# Patient Record
Sex: Female | Born: 1935 | Race: White | Hispanic: No | Smoking: Never smoker
Health system: Southern US, Community
[De-identification: ages and names within clinical notes are randomized; demographics above are authoritative.]

## PROBLEM LIST (undated history)

## (undated) DIAGNOSIS — G20A1 Parkinson's disease without dyskinesia, without mention of fluctuations: Secondary | ICD-10-CM

## (undated) DIAGNOSIS — G20 Parkinson's disease: Secondary | ICD-10-CM

## (undated) DIAGNOSIS — F0390 Unspecified dementia without behavioral disturbance: Secondary | ICD-10-CM

## (undated) DIAGNOSIS — I639 Cerebral infarction, unspecified: Secondary | ICD-10-CM

## (undated) HISTORY — PX: KNEE RECONSTRUCTION: SHX5883

---

## 2009-11-06 ENCOUNTER — Emergency Department: Payer: Self-pay | Admitting: Internal Medicine

## 2011-11-09 ENCOUNTER — Emergency Department: Payer: Self-pay | Admitting: Emergency Medicine

## 2015-01-16 ENCOUNTER — Observation Stay
Admission: EM | Admit: 2015-01-16 | Discharge: 2015-01-19 | Disposition: A | Discharge status: Skilled Nursing Facility | Payer: Medicare Other | Attending: Internal Medicine | Admitting: Internal Medicine

## 2015-01-16 ENCOUNTER — Emergency Department: Payer: Medicare Other

## 2015-01-16 DIAGNOSIS — Z79899 Other long term (current) drug therapy: Secondary | ICD-10-CM | POA: Insufficient documentation

## 2015-01-16 DIAGNOSIS — W19XXXA Unspecified fall, initial encounter: Secondary | ICD-10-CM | POA: Diagnosis not present

## 2015-01-16 DIAGNOSIS — N390 Urinary tract infection, site not specified: Principal | ICD-10-CM

## 2015-01-16 DIAGNOSIS — Z7982 Long term (current) use of aspirin: Secondary | ICD-10-CM | POA: Diagnosis not present

## 2015-01-16 DIAGNOSIS — Z8673 Personal history of transient ischemic attack (TIA), and cerebral infarction without residual deficits: Secondary | ICD-10-CM | POA: Insufficient documentation

## 2015-01-16 DIAGNOSIS — L03115 Cellulitis of right lower limb: Secondary | ICD-10-CM | POA: Insufficient documentation

## 2015-01-16 DIAGNOSIS — Z7952 Long term (current) use of systemic steroids: Secondary | ICD-10-CM | POA: Diagnosis not present

## 2015-01-16 DIAGNOSIS — G20 Parkinson's disease: Secondary | ICD-10-CM | POA: Diagnosis not present

## 2015-01-16 DIAGNOSIS — I10 Essential (primary) hypertension: Secondary | ICD-10-CM | POA: Diagnosis not present

## 2015-01-16 DIAGNOSIS — Z66 Do not resuscitate: Secondary | ICD-10-CM | POA: Insufficient documentation

## 2015-01-16 DIAGNOSIS — Z881 Allergy status to other antibiotic agents status: Secondary | ICD-10-CM | POA: Insufficient documentation

## 2015-01-16 DIAGNOSIS — S80811A Abrasion, right lower leg, initial encounter: Secondary | ICD-10-CM

## 2015-01-16 DIAGNOSIS — R531 Weakness: Secondary | ICD-10-CM | POA: Diagnosis not present

## 2015-01-16 DIAGNOSIS — N289 Disorder of kidney and ureter, unspecified: Secondary | ICD-10-CM | POA: Insufficient documentation

## 2015-01-16 DIAGNOSIS — R627 Adult failure to thrive: Secondary | ICD-10-CM | POA: Diagnosis not present

## 2015-01-16 DIAGNOSIS — F0150 Vascular dementia without behavioral disturbance: Secondary | ICD-10-CM | POA: Diagnosis not present

## 2015-01-16 DIAGNOSIS — Z23 Encounter for immunization: Secondary | ICD-10-CM | POA: Diagnosis not present

## 2015-01-16 DIAGNOSIS — Z9889 Other specified postprocedural states: Secondary | ICD-10-CM | POA: Insufficient documentation

## 2015-01-16 DIAGNOSIS — L0390 Cellulitis, unspecified: Secondary | ICD-10-CM

## 2015-01-16 HISTORY — DX: Unspecified dementia, unspecified severity, without behavioral disturbance, psychotic disturbance, mood disturbance, and anxiety: F03.90

## 2015-01-16 HISTORY — DX: Parkinson's disease: G20

## 2015-01-16 HISTORY — DX: Cerebral infarction, unspecified: I63.9

## 2015-01-16 HISTORY — DX: Parkinson's disease without dyskinesia, without mention of fluctuations: G20.A1

## 2015-01-16 LAB — CBC WITH DIFFERENTIAL/PLATELET
BASOS ABS: 0 10*3/uL (ref 0–0.1)
BASOS PCT: 0 %
EOS ABS: 0 10*3/uL (ref 0–0.7)
EOS PCT: 0 %
HCT: 35.9 % (ref 35.0–47.0)
Hemoglobin: 12 g/dL (ref 12.0–16.0)
Lymphocytes Relative: 5 %
Lymphs Abs: 0.5 10*3/uL — ABNORMAL LOW (ref 1.0–3.6)
MCH: 29.5 pg (ref 26.0–34.0)
MCHC: 33.3 g/dL (ref 32.0–36.0)
MCV: 88.6 fL (ref 80.0–100.0)
MONO ABS: 0.7 10*3/uL (ref 0.2–0.9)
Monocytes Relative: 6 %
Neutro Abs: 9.7 10*3/uL — ABNORMAL HIGH (ref 1.4–6.5)
Neutrophils Relative %: 89 %
PLATELETS: 163 10*3/uL (ref 150–440)
RBC: 4.06 MIL/uL (ref 3.80–5.20)
RDW: 14.5 % (ref 11.5–14.5)
WBC: 10.9 10*3/uL (ref 3.6–11.0)

## 2015-01-16 LAB — COMPREHENSIVE METABOLIC PANEL
ALBUMIN: 4 g/dL (ref 3.5–5.0)
ALT: 18 U/L (ref 14–54)
AST: 115 U/L — AB (ref 15–41)
Alkaline Phosphatase: 44 U/L (ref 38–126)
Anion gap: 8 (ref 5–15)
BUN: 27 mg/dL — AB (ref 6–20)
CHLORIDE: 103 mmol/L (ref 101–111)
CO2: 28 mmol/L (ref 22–32)
Calcium: 9.3 mg/dL (ref 8.9–10.3)
Creatinine, Ser: 1.15 mg/dL — ABNORMAL HIGH (ref 0.44–1.00)
GFR calc Af Amer: 49 mL/min — ABNORMAL LOW (ref >60)
GFR, EST NON AFRICAN AMERICAN: 42 mL/min — AB (ref >60)
Glucose, Bld: 130 mg/dL — ABNORMAL HIGH (ref 65–99)
POTASSIUM: 3.9 mmol/L (ref 3.5–5.1)
SODIUM: 139 mmol/L (ref 135–145)
Total Bilirubin: 0.9 mg/dL (ref 0.3–1.2)
Total Protein: 7.1 g/dL (ref 6.5–8.1)

## 2015-01-16 LAB — URINALYSIS COMPLETE WITH MICROSCOPIC (ARMC ONLY)
BILIRUBIN URINE: NEGATIVE
GLUCOSE, UA: NEGATIVE mg/dL
Nitrite: POSITIVE — AB
PH: 5 (ref 5.0–8.0)
Protein, ur: 30 mg/dL — AB
SPECIFIC GRAVITY, URINE: 1.025 (ref 1.005–1.030)

## 2015-01-16 LAB — TROPONIN I: TROPONIN I: 0.03 ng/mL (ref <0.031)

## 2015-01-16 MED ORDER — BISACODYL 10 MG RE SUPP: 10.0000 mg | Freq: Every day | RECTAL | Status: DC | PRN

## 2015-01-16 MED ORDER — PNEUMOCOCCAL VAC POLYVALENT 25 MCG/0.5ML IJ INJ
0.5000 mL | INJECTION | INTRAMUSCULAR | Status: AC
  Administered 2015-01-17: 0.5 mL via INTRAMUSCULAR
  Filled 2015-01-16: qty 0.5

## 2015-01-16 MED ORDER — SENNA 8.6 MG PO TABS
2.0000 | ORAL_TABLET | Freq: Every day | ORAL | Status: DC | PRN
Start: 2015-01-16 — End: 2015-01-19
  Administered 2015-01-17: 17.2 mg via ORAL
  Filled 2015-01-16: qty 2

## 2015-01-16 MED ORDER — NYSTATIN 100000 UNIT/GM EX POWD
1.0000 g | Freq: Two times a day (BID) | CUTANEOUS | Status: DC
  Administered 2015-01-16 – 2015-01-19 (×6): 1 g via TOPICAL
  Filled 2015-01-16: qty 15

## 2015-01-16 MED ORDER — PRIMIDONE 250 MG PO TABS
250.0000 mg | ORAL_TABLET | Freq: Every day | ORAL | Status: DC
  Administered 2015-01-16 – 2015-01-18 (×3): 250 mg via ORAL
  Filled 2015-01-16 (×4): qty 1

## 2015-01-16 MED ORDER — HYDROCODONE-ACETAMINOPHEN 5-325 MG PO TABS: 1.0000 | ORAL_TABLET | ORAL | Status: DC | PRN

## 2015-01-16 MED ORDER — ONDANSETRON HCL 4 MG/2ML IJ SOLN: 4.0000 mg | Freq: Four times a day (QID) | INTRAMUSCULAR | Status: DC | PRN

## 2015-01-16 MED ORDER — PANTOPRAZOLE SODIUM 40 MG PO TBEC
40.0000 mg | DELAYED_RELEASE_TABLET | Freq: Every day | ORAL | Status: DC
  Administered 2015-01-17 – 2015-01-19 (×3): 40 mg via ORAL
  Filled 2015-01-16 (×3): qty 1

## 2015-01-16 MED ORDER — ONDANSETRON HCL 4 MG PO TABS: 4.0000 mg | ORAL_TABLET | Freq: Four times a day (QID) | ORAL | Status: DC | PRN

## 2015-01-16 MED ORDER — CARBIDOPA-LEVODOPA 25-250 MG PO TABS
1.0000 | ORAL_TABLET | Freq: Two times a day (BID) | ORAL | Status: DC
  Administered 2015-01-16 – 2015-01-19 (×6): 1 via ORAL
  Filled 2015-01-16 (×6): qty 1

## 2015-01-16 MED ORDER — PREDNISONE 5 MG PO TABS
5.0000 mg | ORAL_TABLET | Freq: Every day | ORAL | Status: DC
  Administered 2015-01-17 – 2015-01-19 (×3): 5 mg via ORAL
  Filled 2015-01-16 (×3): qty 1

## 2015-01-16 MED ORDER — DOCUSATE SODIUM 100 MG PO CAPS
100.0000 mg | ORAL_CAPSULE | Freq: Two times a day (BID) | ORAL | Status: DC
  Administered 2015-01-16 – 2015-01-19 (×7): 100 mg via ORAL
  Filled 2015-01-16 (×7): qty 1

## 2015-01-16 MED ORDER — POTASSIUM CHLORIDE IN NACL 20-0.9 MEQ/L-% IV SOLN
INTRAVENOUS | Status: DC
  Administered 2015-01-16: 1000 mL via INTRAVENOUS
  Administered 2015-01-16 – 2015-01-19 (×6): via INTRAVENOUS
  Filled 2015-01-16 (×11): qty 1000

## 2015-01-16 MED ORDER — SULFAMETHOXAZOLE-TRIMETHOPRIM 800-160 MG PO TABS
1.0000 | ORAL_TABLET | Freq: Two times a day (BID) | ORAL | Status: DC
  Administered 2015-01-16 – 2015-01-19 (×6): 1 via ORAL
  Filled 2015-01-16 (×7): qty 1

## 2015-01-16 MED ORDER — ASPIRIN 81 MG PO CHEW
81.0000 mg | CHEWABLE_TABLET | Freq: Every day | ORAL | Status: DC
  Administered 2015-01-17 – 2015-01-19 (×3): 81 mg via ORAL
  Filled 2015-01-16 (×3): qty 1

## 2015-01-16 MED ORDER — HEPARIN SODIUM (PORCINE) 5000 UNIT/ML IJ SOLN
5000.0000 [IU] | Freq: Three times a day (TID) | INTRAMUSCULAR | Status: DC
  Administered 2015-01-16 – 2015-01-18 (×6): 5000 [IU] via SUBCUTANEOUS
  Filled 2015-01-16 (×6): qty 1

## 2015-01-16 MED ORDER — ACETAMINOPHEN 325 MG PO TABS
650.0000 mg | ORAL_TABLET | Freq: Four times a day (QID) | ORAL | Status: DC | PRN
  Administered 2015-01-16 – 2015-01-19 (×2): 650 mg via ORAL
  Filled 2015-01-16 (×2): qty 2

## 2015-01-16 MED ORDER — ACETAMINOPHEN 650 MG RE SUPP
650.0000 mg | Freq: Four times a day (QID) | RECTAL | Status: DC | PRN
Start: 2015-01-16 — End: 2015-01-19

## 2015-01-16 MED ORDER — METOPROLOL SUCCINATE ER 50 MG PO TB24
50.0000 mg | ORAL_TABLET | Freq: Every day | ORAL | Status: DC
  Administered 2015-01-17 – 2015-01-19 (×3): 50 mg via ORAL
  Filled 2015-01-16 (×3): qty 1

## 2015-01-16 MED ORDER — SULFAMETHOXAZOLE-TRIMETHOPRIM 800-160 MG PO TABS
1.0000 | ORAL_TABLET | Freq: Once | ORAL | Status: AC
  Administered 2015-01-16: 1 via ORAL
  Filled 2015-01-16: qty 1

## 2015-01-16 MED ORDER — INFLUENZA VAC SPLIT QUAD 0.5 ML IM SUSY
0.5000 mL | PREFILLED_SYRINGE | INTRAMUSCULAR | Status: AC
Start: 2015-01-17 — End: 2015-01-17
  Administered 2015-01-17: 0.5 mL via INTRAMUSCULAR
  Filled 2015-01-16: qty 0.5

## 2015-01-16 MED ORDER — VENLAFAXINE HCL 37.5 MG PO TABS
37.5000 mg | ORAL_TABLET | Freq: Every day | ORAL | Status: DC
  Administered 2015-01-17 – 2015-01-19 (×3): 37.5 mg via ORAL
  Filled 2015-01-16 (×3): qty 1

## 2015-01-16 MED ORDER — LOSARTAN POTASSIUM 50 MG PO TABS
50.0000 mg | ORAL_TABLET | Freq: Every day | ORAL | Status: DC
  Administered 2015-01-17 – 2015-01-19 (×3): 50 mg via ORAL
  Filled 2015-01-16 (×3): qty 1

## 2015-01-16 NOTE — ED Provider Notes (Addendum)
[facility] Emergency Department Provider Note  ____________________________________________  Time seen: Approximately 11:40 AM  I have reviewed the triage vital signs and the nursing notes.   HISTORY  Chief Complaint Altered Mental Status  Hx is limited due to pt dementa.  HPI Joy Clements is a 81 y.o. female  In assisted living w/ hx of CVA, Parkinson's disease, and dementia brought by EMS for "being off."  This morning, patient was being visited by her daughter who felt that she was "leaning to the right" and more weak than usual. The patient is unable to give me any history, states that she is not in any pain at this time. She denies dysuria.    Per daughter, "decline" over the past few days.  Increased weakness.  Sustained a fall.  Concern for stroke, per other daughter, "leaning" "slurred speech."  Baseline: no weakness, continues to have some slurred speech, expressive aphasia.   Past Medical History  Diagnosis Date  . Stroke   . Parkinson's disease   . Dementia     There are no active problems to display for this patient.   Past Surgical History  Procedure Laterality Date  . Knee reconstruction      No current outpatient prescriptions on file.  Allergies Ampicillin  No family history on file.  Social History Social History  Substance Use Topics  . Smoking status: Never Smoker   . Smokeless tobacco: Never Used  . Alcohol Use: No    Review of Systems Unable to obtain due to patient dementia 10-point ROS otherwise negative.  ____________________________________________   PHYSICAL EXAM:  VITAL SIGNS: ED Triage Vitals  Enc Vitals Group     BP 01/16/15 1131 103/66 mmHg     Pulse Rate 01/16/15 1131 72     Resp --      Temp 01/16/15 1131 98.1 F (36.7 C)     Temp Source 01/16/15 1131 Oral     SpO2 01/16/15 1131 100 %     Weight 01/16/15 1131 177 lb (80.287 kg)     Height 01/16/15 1131  (1.702 m)     Head Cir  --      Peak Flow --      Pain Score 01/16/15 1132 0     Pain Loc --      Pain Edu? --      Excl. in GC? --     Constitutional: Alert and answering questions but not alert oriented to place, year, or reason that she is here. Eyes: Conjunctivae are normal.  EOMI. Head: Atraumatic. Nose: No congestion/rhinnorhea. Mouth/Throat: Mucous membranes are moist.  Neck: No stridor.  Supple.   Cardiovascular: Normal rate, regular rhythm. No murmurs, rubs or gallops.  Respiratory: Normal respiratory effort.  No retractions. Lungs CTAB.  No wheezes, rales or ronchi. Gastrointestinal: Soft and nontender. No distention. No peritoneal signs. Overweight. Musculoskeletal: No LE edema.  Neurologic:  Patient's face and smile symmetric. EOMI. PERRLA.  No pronator drift. 5 out of 5 bilateral grip, biceps, triceps, hip flexors, foot flexion and extension. Normal sensation to light touch in the face, upper sternal recent lower cavities. Gait was not assessed due to falls risk. Skin:  2x3" superficial abrasion on the right lateral leg at the level of the knee with 1.5 inches of surrounding erythema and mild warmth without swelling, no purulence. Full range of motion of the right knee without pain. Psychiatric: Mood and affect are normal.   ____________________________________________   LABS (all labs  ordered are listed, but only abnormal results are displayed)  Labs Reviewed  CBC WITH DIFFERENTIAL/PLATELET - Abnormal; Notable for the following:    Neutro Abs 9.7 (*)    Lymphs Abs 0.5 (*)    All other components within normal limits  COMPREHENSIVE METABOLIC PANEL - Abnormal; Notable for the following:    Glucose, Bld 130 (*)    BUN 27 (*)    Creatinine, Ser 1.15 (*)    AST 115 (*)    GFR calc non Af Amer 42 (*)    GFR calc Af Amer 49 (*)    All other components within normal limits  URINALYSIS COMPLETEWITH MICROSCOPIC ([facility] ONLY) - Abnormal; Notable for the following:    Color, Urine YELLOW (*)     APPearance CLOUDY (*)    Ketones, ur 1+ (*)    Hgb urine dipstick 3+ (*)    Protein, ur 30 (*)    Nitrite POSITIVE (*)    Leukocytes, UA 3+ (*)    Bacteria, UA MANY (*)    Squamous Epithelial / LPF 0-5 (*)    All other components within normal limits  URINE CULTURE  TROPONIN I   ____________________________________________  EKG  ED ECG REPORT I, Rockne Menghini, the attending physician, personally viewed and interpreted this ECG.   Date: 01/16/2015  EKG Time: 1130  Rate: 72  Rhythm: RBBB  Axis: Normal axis  Intervals:none  ST&T Change: No ST elevation.  ____________________________________________  RADIOLOGY  Ct Head Wo Contrast  01/16/2015   CLINICAL DATA:  Larey Seat today. Altered mental status and confusion. History of dementia, Parkinson's disease and stroke.  EXAM: CT HEAD WITHOUT CONTRAST  TECHNIQUE: Contiguous axial images were obtained from the base of the skull through the vertex without intravenous contrast.  COMPARISON:  None.  FINDINGS: Age related cerebral atrophy, ventriculomegaly and periventricular white matter disease. No extra-axial fluid collections are identified. No CT findings for acute hemispheric infarction or intracranial hemorrhage. No mass lesions. The brainstem and cerebellum are normal.  No acute bony findings. No bone lesions. The paranasal sinuses and mastoid air cells are clear. The globes are intact.  IMPRESSION: Age related cerebral atrophy, ventriculomegaly and periventricular white matter disease. No acute intracranial findings or skull fracture.   Electronically Signed   By: Rudie Meyer M.D.   On: 01/16/2015 12:39    ____________________________________________   PROCEDURES  Procedure(s) performed: None  Critical Care performed: No ____________________________________________   INITIAL IMPRESSION / ASSESSMENT AND PLAN / ED COURSE  Pertinent labs & imaging results that were available during my care of the patient were reviewed by  me and considered in my medical decision making (see chart for details).  81 y.o. female with history of CVA and Parkinson's disease, dementia, sent by family for decreased energy, fall, and "being off." The patient's family reports that she has been like this before when she has had a UTI which we will evaluate here today. Also plan to send labs, and get a CT of the head to rule out stroke. I do not see any focal findings neurologically on my exam. I've spoken with the patient's daughter, POA, she understands and is in agreement with the plan.  ----------------------------------------- 12:33 PM on 01/16/2015 -----------------------------------------  Patient has a urinary tract infection and likely an overlying mild cellulitis on the right lower extremity. I will treat her with Bactrim which should address both the urine and the skin infection. Urine culture has been sent.  ----------------------------------------- 1:01 PM on 01/16/2015 -----------------------------------------  The patient's son has arrived to the bedside and states that his mother has had a recent decline in her health. He is concerned about her increased falls. We'll plan to admit the patient for her UTI, renal insufficiency, and cellulitis.  ____________________________________________  FINAL CLINICAL IMPRESSION(S) / ED DIAGNOSES  Final diagnoses:  UTI (lower urinary tract infection)  Abrasion of lower extremity, right, initial encounter  Fall, initial encounter  Renal insufficiency  Cellulitis of right lower extremity      NEW MEDICATIONS STARTED DURING THIS VISIT:  New Prescriptions   No medications on file     Rockne Menghini, MD 01/16/15 1301  Rockne Menghini, MD 01/16/15 1301

## 2015-01-16 NOTE — H&P (Signed)
History and Physical    Joy Clements ZOX:[id number] DOB: 10/29/1926 DOA: 01/16/2015  Referring physician: Dr. Sharma Covert PCP: No PCP Per Patient  Specialists: none  Chief Complaint: weakness  HPI: Joy Clements is a 81 y.o. female has a past medical history significant for CVA, Parkinson's and vascular dementia now with progressive weakness and inability to ambulate. Falling often. Brought to ER where she was found to have a RLE abrasion with localized cellulitis and UTI. Unable to stand or ambulate in ER. Cannot be cared for at her ALF. She is now admitted for further evaluation.  Review of Systems: The patient denies anorexia, fever, weight loss,, vision loss, decreased hearing, hoarseness, chest pain, syncope, dyspnea on exertion, peripheral edema, hemoptysis, abdominal pain, melena, hematochezia, severe indigestion/heartburn, hematuria, incontinence, genital sores, suspicious skin lesions, transient blindness, depression, unusual weight change, abnormal bleeding, enlarged lymph nodes, angioedema, and breast masses.   Past Medical History  Diagnosis Date  . Stroke   . Parkinson's disease   . Dementia    Past Surgical History  Procedure Laterality Date  . Knee reconstruction     Social History:  reports that she has never smoked. She has never used smokeless tobacco. She reports that she does not drink alcohol or use illicit drugs.  Allergies  Allergen Reactions  . Ampicillin     History reviewed. No pertinent family history.  Prior to Admission medications   Not on File   Physical Exam: Filed Vitals:   01/16/15 1230 01/16/15 1245 01/16/15 1300 01/16/15 1315  BP: 134/72  147/85   Pulse: 75 71 77 77  Temp:      TempSrc:      Resp: Height:      Weight:      SpO2: 97% 97% 97% 99%     General:  No apparent distress  Eyes: PERRL, EOMI, no scleral icterus  ENT: moist oropharynx  Neck: supple, no lymphadenopathy  Cardiovascular: regular  rate without MRG; 2+ peripheral pulses, no JVD, no peripheral edema  Respiratory: CTA biL, good air movement without wheezing, rhonchi or crackled  Abdomen: soft, non tender to palpation, positive bowel sounds, no guarding, no rebound  Skin: no rashes. Abrasion noted to RLE with surrounding erythema and drainage  Musculoskeletal: normal bulk and tone, no joint swelling  Psychiatric: normal mood and affect  Neurologic: CN 2-12 grossly intact, MS 4/5 in all 4  Labs on Admission:  Basic Metabolic Panel:  Recent Labs Lab 01/16/15 1144  NA 139  K 3.9  CL 103  CO2 28  GLUCOSE 130*  BUN 27*  CREATININE 1.15*  CALCIUM 9.3   Liver Function Tests:  Recent Labs Lab 01/16/15 1144  AST 115*  ALT 18  ALKPHOS 44  BILITOT 0.9  PROT 7.1  ALBUMIN 4.0   No results for input(s): LIPASE, AMYLASE in the last 168 hours. No results for input(s): AMMONIA in the last 168 hours. CBC:  Recent Labs Lab 01/16/15 1144  WBC 10.9  NEUTROABS 9.7*  HGB 12.0  HCT 35.9  MCV 88.6  PLT 163   Cardiac Enzymes:  Recent Labs Lab 01/16/15 1144  TROPONINI 0.03    BNP (last 3 results) No results for input(s): BNP in the last 8760 hours.  ProBNP (last 3 results) No results for input(s): PROBNP in the last 8760 hours.  CBG: No results for input(s): GLUCAP in the last 168 hours.  Radiological Exams on Admission: Ct Head Wo Contrast  01/16/2015  CLINICAL DATA:  Larey Seat today. Altered mental status and confusion. History of dementia, Parkinson's disease and stroke.  EXAM: CT HEAD WITHOUT CONTRAST  TECHNIQUE: Contiguous axial images were obtained from the base of the skull through the vertex without intravenous contrast.  COMPARISON:  None.  FINDINGS: Age related cerebral atrophy, ventriculomegaly and periventricular white matter disease. No extra-axial fluid collections are identified. No CT findings for acute hemispheric infarction or intracranial hemorrhage. No mass lesions. The brainstem and  cerebellum are normal.  No acute bony findings. No bone lesions. The paranasal sinuses and mastoid air cells are clear. The globes are intact.  IMPRESSION: Age related cerebral atrophy, ventriculomegaly and periventricular white matter disease. No acute intracranial findings or skull fracture.   Electronically Signed   By: Rudie Meyer M.D.   On: 01/16/2015 12:39    EKG: Independently reviewed.  Assessment/Plan Active Problems:   Failure to thrive in adult   Acute UTI   Cellulitis   Will observe on floor with IV fluids and po ABX. Consult PT and CSW. Pt is DNR. Repeat labs in AM.  Diet: soft Fluids: NS with K+ DVT Prophylaxis: SQ Heparin  Code Status: DNR  Family Communication: yes  Disposition Plan: SNF  Time spent: 50 min

## 2015-01-16 NOTE — ED Notes (Signed)
Family states she seems "off". Thinks it may be a UTI.

## 2015-01-16 NOTE — ED Notes (Signed)
Patient to CT.

## 2015-01-16 NOTE — Plan of Care (Signed)
Problem: Discharge Progression Outcomes Goal: Other Discharge Outcomes/Goals Outcome: Progressing Afebrile. VSS. Orthostatic VS negative lying and sitting. Pt unable to tolerate standing. Alert to self only. Follow simple commands. Speech clear, but limited. No signs of pain nor discomfort. Incontinent. Tolerates diet. Assistance with set up required.

## 2015-01-17 LAB — CBC
HCT: 31.3 % — ABNORMAL LOW (ref 35.0–47.0)
Hemoglobin: 10.6 g/dL — ABNORMAL LOW (ref 12.0–16.0)
MCH: 30 pg (ref 26.0–34.0)
MCHC: 33.8 g/dL (ref 32.0–36.0)
MCV: 88.8 fL (ref 80.0–100.0)
PLATELETS: 127 10*3/uL — AB (ref 150–440)
RBC: 3.53 MIL/uL — AB (ref 3.80–5.20)
RDW: 14.6 % — AB (ref 11.5–14.5)
WBC: 7.2 10*3/uL (ref 3.6–11.0)

## 2015-01-17 LAB — BASIC METABOLIC PANEL
Anion gap: 6 (ref 5–15)
BUN: 23 mg/dL — AB (ref 6–20)
CHLORIDE: 110 mmol/L (ref 101–111)
CO2: 26 mmol/L (ref 22–32)
CREATININE: 0.97 mg/dL (ref 0.44–1.00)
Calcium: 8.7 mg/dL — ABNORMAL LOW (ref 8.9–10.3)
GFR calc Af Amer: >60 mL/min (ref >60)
GFR calc non Af Amer: 52 mL/min — ABNORMAL LOW (ref >60)
GLUCOSE: 100 mg/dL — AB (ref 65–99)
Potassium: 3.9 mmol/L (ref 3.5–5.1)
Sodium: 142 mmol/L (ref 135–145)

## 2015-01-17 NOTE — Clinical Social Work Note (Signed)
Clinical Social Work Assessment  Patient Details  Name: Joy Clements MRN: [id number] Date of Birth: 05/03/1926  Date of referral:  01/16/15               Reason for consult:  Facility Placement                Permission sought to share information with:    Permission granted to share information::     Name::     [name] Roland/daughter/POA [phone]  Agency::     Relationship::     Contact Information:     Housing/Transportation Living arrangements for the past 2 months:  Assisted Living Facility Source of Information:  Facility Patient Interpreter Needed:  None Criminal Activity/Legal Involvement Pertinent to Current Situation/Hospitalization:  No - Comment as needed Significant Relationships:  Adult Children Lives with:  Facility Resident Do you feel safe going back to the place where you live?  Yes Need for family participation in patient care:  Yes (Comment)  Care giving concerns:  Pt is confused and her functioning has been declining for approximately 1 year.   Social Worker assessment / plan:  CSW spoke to Interior and spatial designer of [employer] and pt's daughter [name] by phone as pt is confused.  Pt has been a resident at [facility] in [place] since 2011.  Pt has been declining for nearly 1 year and staff has talked to family about pt's need for a higher level of care.  Pt's daughter [name] resides in [place] and will be in town Tuesday through Saturday in order to assist with care coordination.  Pt has other adult children that live locally.   Pt's daughter expressed that she does not want pt to return to [place] and prefers that she go to [facility] for SNF.  CSW will initiate bed search and extend bed offers once received.  Employment status:  Retired Health and safety inspector:  Medicare PT Recommendations:  Not assessed at this time Information / Referral to community resources:   SNF  Patient/Family's Response to care:  Pt's daughter was pleasant and  appreciative of CSW assistance.  Patient/Family's Understanding of and Emotional Response to Diagnosis, Current Treatment, and Prognosis:  Pt's daughter is aware that pt needs a higher level of care and is in agreement to a SNF search.    Emotional Assessment Appearance:  Appears stated age Attitude/Demeanor/Rapport:  Other (Pt is confused.) Affect (typically observed):  Other (Confused) Orientation:  Oriented to Self Alcohol / Substance use:  Not Applicable Psych involvement (Current and /or in the community):  No (Comment)  Discharge Needs  Concerns to be addressed:  Cognitive Concerns, Discharge Planning Concerns Readmission within the last 30 days:  No Current discharge risk:  Cognitively Impaired Barriers to Discharge:  Continued Medical Work up   [place], [place] D, LCSW 01/17/2015, 10:59 AM

## 2015-01-17 NOTE — Plan of Care (Signed)
Problem: Discharge Progression Outcomes Goal: Other Discharge Outcomes/Goals Outcome: Progressing Plan of care progress to goal: VSS.  Fever spiked at 100, but with tylenol decreased. Pt complains of minimal back pain.  He declined any medications.

## 2015-01-17 NOTE — Plan of Care (Addendum)
Problem: Discharge Progression Outcomes Goal: Other Discharge Outcomes/Goals Outcome: Progressing Plan of care progress to goal for: 1. Pain-no c/o pain this shift 2. Hemodynamically-             -VSS, pt remains afebrile this shift  -IV fluids continue per orders 3. Complications-no evidence of this shift 4. Diet-pt tolerating diet this shift 5. Activity-pt is 2 assist to the [facility] and chair

## 2015-01-17 NOTE — Evaluation (Addendum)
Physical Therapy Evaluation Patient Details Name: Joy Clements MRN: [id number] DOB: 11/24/1926 Today's Date: 01/17/2015   History of Present Illness  presented to ER secondary to progressive weakness, inability to ambulate and frequent falls; admitted with failure to thrive, LE cellulitis and UTI.  Clinical Impression  Upon evaluation, patient alert and oriented to self only.  Generally confused, but follows simple, one step commands.  Currently requiring mod assist for bed mobility and sit/stand with RW; mod assist +2 for bed/chair transfer with RW.  Heavy posterior weight shift/trunk lean with limited/no righting reactions.  Displays very short, shuffling steps during transfer. Unsafe for additional gait efforts or attempts without +2 at this time.  Very high fall risk. Would benefit from skilled PT to address above deficits and promote optimal return to PLOF; recommend transition to STR upon discharge from acute hospitalization if patient able to actively participate/progress with skilled PT (question ability due to baseline cognitive deficits) and if previous assisted living facility unable to provide +2 assist with all transfers with WC level as primary mobility.     Follow Up Recommendations SNF (will closely monitor ability to participate/progress with structured rehab program)    Equipment Recommendations  Rolling walker with 5" wheels    Recommendations for Other Services       Precautions / Restrictions Precautions Precautions: Fall Restrictions Weight Bearing Restrictions: No      Mobility  Bed Mobility Overal bed mobility: Needs Assistance Bed Mobility: Supine to Sit     Supine to sit: Mod assist     General bed mobility comments: assist for initiation, sequencing of movement; assist for truncal elevation  Transfers Overall transfer level: Needs assistance Equipment used: Rolling walker (2 wheeled) Transfers: Sit to/from Stand Sit to Stand: Mod assist          General transfer comment: heavy posterior weight shift; assist for LE foot placement with each movement transition  Ambulation/Gait Ambulation/Gait assistance: Mod assist;+2 physical assistance Ambulation Distance (Feet): 3 Feet Assistive device: Rolling walker (2 wheeled)       General Gait Details: heavy posterior weight shift, very short, shuffling steps; poor balance reactions.  Very high fall risk.  Unsafe to attempt without +2 at this time.  Stairs            Wheelchair Mobility    Modified Rankin (Stroke Patients Only)       Balance Overall balance assessment: Needs assistance Sitting-balance support: No upper extremity supported;Feet supported Sitting balance-Leahy Scale: Fair Sitting balance - Comments: min assist   Standing balance support: Bilateral upper extremity supported Standing balance-Leahy Scale: Poor                               Pertinent Vitals/Pain Pain Assessment: Faces Faces Pain Scale: Hurts little more Pain Location: R > L LE? Pain Descriptors / Indicators: Grimacing Pain Intervention(s): Monitored during session;Limited activity within patient's tolerance;Repositioned    Home Living Family/patient expects to be discharged to:: Skilled nursing facility                      Prior Function Level of Independence: Needs assistance         Comments: Patient unable to describe PLOF/level of assist needed; will verify with family/facility as available. Per Child psychotherapist, requiring only +1 assist at most.     Hand Dominance        Extremity/Trunk Assessment   Upper Extremity Assessment:  Generalized weakness (shoulder elevation to shoulder height bilat; strength at least 3-/5 throughout)           Lower Extremity Assessment: Generalized weakness (bilat ankles to neutral DF only; strength at least 3-/5)         Communication   Communication: Other (comment) (clear at times, garbled/non-sensical  at times)  Cognition Arousal/Alertness: Awake/alert Behavior During Therapy: WFL for tasks assessed/performed Overall Cognitive Status: Difficult to assess (patient alert and oriented to self only; unaware of location, situation.  Follows simple, one-step commands.  Slightly motor restless at times.)                      General Comments General comments (skin integrity, edema, etc.): abrasion to R lateral knee/upper calf    Exercises Other Exercises Other Exercises: Sit/stand x3 with RW, mod assist--working to improve anterior weight translation and standing balance. Postural alignment improved with correct foot placement (positioned/blocked by therapist),but still unable to achieve neutral postural alignment in A/P plane. Patient very fearful.  (8 min)      Assessment/Plan    PT Assessment Patient needs continued PT services  PT Diagnosis Difficulty walking;Generalized weakness   PT Problem List Decreased strength;Decreased range of motion;Decreased activity tolerance;Decreased mobility;Decreased balance;Decreased cognition;Decreased knowledge of use of DME;Decreased safety awareness;Decreased knowledge of precautions  PT Treatment Interventions DME instruction;Gait training;Stair training;Functional mobility training;Therapeutic activities;Therapeutic exercise;Patient/family education   PT Goals (Current goals can be found in the Care Plan section) Acute Rehab PT Goals Patient Stated Goal: patient unable to verbalize PT Goal Formulation: Patient unable to participate in goal setting Time For Goal Achievement: 01/31/15 Potential to Achieve Goals: Fair    Frequency Min 2X/week   Barriers to discharge Decreased caregiver support      Co-evaluation               End of Session Equipment Utilized During Treatment: Gait belt Activity Tolerance: Patient tolerated treatment well Patient left: in chair;with call bell/phone within reach;with chair alarm set Nurse  Communication: Mobility status (+2 for return to bed)    Functional Assessment Tool Used: clinical judgement Functional Limitation: Mobility: Walking and moving around Mobility: Walking and Moving Around Current Status [phone]): At least 60 percent but less than 80 percent impaired, limited or restricted Mobility: Walking and Moving Around Goal Status [phone]): At least 20 percent but less than 40 percent impaired, limited or restricted    Time: 1020-1038 PT Time Calculation (min) (ACUTE ONLY): 18 min   Charges:   PT Evaluation $Initial PT Evaluation Tier I: 1 Procedure PT Treatments $Therapeutic Activity: 8-22 mins   PT G Codes:   PT G-Codes **NOT FOR INPATIENT CLASS** Functional Assessment Tool Used: clinical judgement Functional Limitation: Mobility: Walking and moving around Mobility: Walking and Moving Around Current Status ([id number]): At least 60 percent but less than 80 percent impaired, limited or restricted Mobility: Walking and Moving Around Goal Status [phone]): At least 20 percent but less than 40 percent impaired, limited or restricted    Kristen H. Manson Passey, PT, DPT, NCS 01/17/2015, 12:58 PM [phone]

## 2015-01-17 NOTE — Progress Notes (Signed)
[facility] Physicians -  at [facility]   PATIENT NAME: Joy Clements    MR#:  [id number]  DATE OF BIRTH:  [birth year]  SUBJECTIVE:  CHIEF COMPLAINT:   Chief Complaint  Patient presents with  . Altered Mental Status   No complaints. Denies abdominal pain. Has a breakfast without nausea or vomiting.  REVIEW OF SYSTEMS:   Review of Systems  Constitutional: Negative for fever.  Respiratory: Negative for shortness of breath.   Cardiovascular: Negative for chest pain and palpitations.  Gastrointestinal: Negative for nausea, vomiting and abdominal pain.  Genitourinary: Negative for dysuria.   DRUG ALLERGIES:   Allergies  Allergen Reactions  . Ampicillin Other (See Comments)    Unknown reaction not indicated on MAR.    VITALS:  Blood pressure 134/79, pulse 74, temperature 98.4 F (36.9 C), temperature source Oral, resp. rate 18, height  (1.626 m), weight 76.613 kg (168 lb 14.4 oz), SpO2 95 %.  PHYSICAL EXAMINATION:  GENERAL:  81 y.o.-year-old patient sitting up in chair with no acute distress.  EYES: Pupils equal, round, reactive to light and accommodation. No scleral icterus. Extraocular muscles intact.  HEENT: Head atraumatic, normocephalic. Oropharynx and nasopharynx clear. Mucous membranes are moist NECK:  Supple, no jugular venous distention. No thyroid enlargement, no tenderness.  LUNGS: Normal breath sounds bilaterally, no wheezing, rales,rhonchi or crepitation. No use of accessory muscles of respiration.  CARDIOVASCULAR: S1, S2 normal. No murmurs, rubs, or gallops.  ABDOMEN: Soft, nontender, nondistended. Bowel sounds present. No organomegaly or mass.  EXTREMITIES: No pedal edema, cyanosis, or clubbing.  NEUROLOGIC: Cranial nerves II through XII are intact. Muscle strength 5/5 in all extremities. Sensation intact.  PSYCHIATRIC: The patient is alert, oriented to person only, pleasant, calm SKIN: Abrasion over right lower extremity,  minimal surrounding erythema, no drainage noted   LABORATORY PANEL:   CBC  Recent Labs Lab 01/17/15 0443  WBC 7.2  HGB 10.6*  HCT 31.3*  PLT 127*   ------------------------------------------------------------------------------------------------------------------  Chemistries   Recent Labs Lab 01/16/15 1144 01/17/15 0443  NA 139 142  K 3.9 3.9  CL 103 110  CO2 28 26  GLUCOSE 130* 100*  BUN 27* 23*  CREATININE 1.15* 0.97  CALCIUM 9.3 8.7*  AST 115*  --   ALT 18  --   ALKPHOS 44  --   BILITOT 0.9  --    ------------------------------------------------------------------------------------------------------------------  Cardiac Enzymes  Recent Labs Lab 01/16/15 1144  TROPONINI 0.03   ------------------------------------------------------------------------------------------------------------------  RADIOLOGY:  Ct Head Wo Contrast  01/16/2015   CLINICAL DATA:  Larey Seat today. Altered mental status and confusion. History of dementia, Parkinson's disease and stroke.  EXAM: CT HEAD WITHOUT CONTRAST  TECHNIQUE: Contiguous axial images were obtained from the base of the skull through the vertex without intravenous contrast.  COMPARISON:  None.  FINDINGS: Age related cerebral atrophy, ventriculomegaly and periventricular white matter disease. No extra-axial fluid collections are identified. No CT findings for acute hemispheric infarction or intracranial hemorrhage. No mass lesions. The brainstem and cerebellum are normal.  No acute bony findings. No bone lesions. The paranasal sinuses and mastoid air cells are clear. The globes are intact.  IMPRESSION: Age related cerebral atrophy, ventriculomegaly and periventricular white matter disease. No acute intracranial findings or skull fracture.   Electronically Signed   By: Rudie Meyer M.D.   On: 01/16/2015 12:39    EKG:   Orders placed or performed during the hospital encounter of 01/16/15  . EKG 12-Lead  . EKG 12-Lead  ASSESSMENT AND PLAN:   #1 urinary tract infection: Urine cultures pending. Treating with Bactrim.  #2 cellulitis: Erythema seems to be decreasing. Treating with Bactrim with good response.  #3 failure to thrive in adult: Has history of CVA, Parkinson's and vascular dementia and now has progressive weakness. Physical therapy evaluation is pending. She will not likely be able to return to assisted living. Social work is following for possible skilled nursing placement.  #3 Parkinson's disease and dementia: Stable at this time no behavioral disturbance. Continue Effexor, Sinemet,  #4 hypertension: Well controlled continue Cozaar, metoprolol,  CODE STATUS: DO NOT RESUSCITATE  TOTAL TIME TAKING CARE OF THIS PATIENT: 30 minutes.  Greater than 50% of time spent in care coordination and counseling. Care plan discussed with social work. Will meet with family tomorrow when her daughter [name] who is the power of attorney is in town. POSSIBLE D/C IN 1-2 DAYS, DEPENDING ON CLINICAL CONDITION.   Elby Showers M.D on 01/17/2015 at 11:46 AM  Between 7am to 6pm - Pager - [phone]  After 6pm go to [email or web address] - password [email or web address]  [facility]  Office  [phone]  CC: Primary care physician; No PCP Per Patient

## 2015-01-17 NOTE — Progress Notes (Signed)
Took over care of patient from Danielle Jacobs at 1400. Woodroe Vogan S, RN  

## 2015-01-18 LAB — BASIC METABOLIC PANEL
Anion gap: 7 (ref 5–15)
BUN: 15 mg/dL (ref 6–20)
CALCIUM: 8.6 mg/dL — AB (ref 8.9–10.3)
CHLORIDE: 107 mmol/L (ref 101–111)
CO2: 22 mmol/L (ref 22–32)
CREATININE: 0.8 mg/dL (ref 0.44–1.00)
GFR calc Af Amer: >60 mL/min (ref >60)
GFR calc non Af Amer: >60 mL/min (ref >60)
Glucose, Bld: 102 mg/dL — ABNORMAL HIGH (ref 65–99)
Potassium: 3.8 mmol/L (ref 3.5–5.1)
SODIUM: 136 mmol/L (ref 135–145)

## 2015-01-18 LAB — CBC
HCT: 31.4 % — ABNORMAL LOW (ref 35.0–47.0)
HEMOGLOBIN: 10.7 g/dL — AB (ref 12.0–16.0)
MCH: 30.2 pg (ref 26.0–34.0)
MCHC: 34 g/dL (ref 32.0–36.0)
MCV: 88.9 fL (ref 80.0–100.0)
Platelets: 138 10*3/uL — ABNORMAL LOW (ref 150–440)
RBC: 3.53 MIL/uL — ABNORMAL LOW (ref 3.80–5.20)
RDW: 14.2 % (ref 11.5–14.5)
WBC: 8.9 10*3/uL (ref 3.6–11.0)

## 2015-01-18 LAB — URINE CULTURE

## 2015-01-18 MED ORDER — ENOXAPARIN SODIUM 40 MG/0.4ML ~~LOC~~ SOLN
40.0000 mg | SUBCUTANEOUS | Status: DC
  Administered 2015-01-18: 21:00:00 40 mg via SUBCUTANEOUS
  Filled 2015-01-18 (×2): qty 0.4

## 2015-01-18 NOTE — Clinical Social Work Note (Signed)
CSW spoke to staff at [facility] who plan to visit and assess pt's needs today.  CSW spoke to pt's daughter and confirmed that the family would like for pt to go to SNF for LTC, private pay.  CSW will continue to follow and assist with d/c planning needs.  [place], [place] [phone]

## 2015-01-18 NOTE — Progress Notes (Signed)
Physical Therapy Treatment Patient Details Name: Joy Clements MRN: [id number] DOB: [birth year] Today's Date: 01/18/2015    History of Present Illness presented to ER secondary to progressive weakness, inability to ambulate and frequent falls; admitted with failure to thrive, LE cellulitis and UTI.    PT Comments    Pt was sitting up in chair upon arrival to the room and appears to be more cognitively alert today. However, pt is still confused and is often unable to answer simple questions. Pt stated that she had to use the bathroom so a stand pivot transfer was performed with mod assist +2 and RW to pt's BSC. Pt has difficulty with initiation of gait and movement and requires PT assist for advancement of RW and weight shifting to facilitate steps/proper step length. Pt requires further skilled PT services in order to facilitate increases in functional mobility and functional strength of UE/LEs.   Follow Up Recommendations  SNF     Equipment Recommendations  Rolling walker with 5" wheels    Recommendations for Other Services       Precautions / Restrictions Precautions Precautions: Fall Restrictions Weight Bearing Restrictions: No    Mobility  Bed Mobility               General bed mobility comments:  (pt in chair upon arrival today, no bed mobility performed)  Transfers Overall transfer level: Needs assistance Equipment used: Rolling walker (2 wheeled) Transfers: Sit to/from [employer] Sit to Stand: Mod assist Stand pivot transfers: Mod assist (stand pivot transfer to [facility] mod assit +2)       General transfer comment: pt tends to have weight shifted posteriorly t/o transfer. Tactile cues given for increased forward trunk flexion during transition from sit<>stand.   Ambulation/Gait Ambulation/Gait assistance: Mod assist;+2 physical assistance Ambulation Distance (Feet): 6 Feet Assistive device: Rolling walker (2 wheeled) Gait  Pattern/deviations: Step-to pattern;Shuffle;Decreased dorsiflexion - right;Decreased dorsiflexion - left;Decreased weight shift to right;Decreased weight shift to left;Trunk flexed Gait velocity: very slow gait with frequent stops  Gait velocity interpretation: <1.8 ft/sec, indicative of risk for recurrent falls General Gait Details: pt displayed short shuffling step-to gait pattern with inconsistent step rhythm.  Pt has difficulty initiating gait and requires assistance by PT for advancement of walker to help with gait initiation. PT assisted weight shifting at hips in order to facilite longer step length. Pt is very fearful of falling.    Stairs            Wheelchair Mobility    Modified Rankin (Stroke Patients Only)       Balance Overall balance assessment: Needs assistance Sitting-balance support: No upper extremity supported;Feet supported Sitting balance-Leahy Scale: Fair Sitting balance - Comments: min assist   Standing balance support: Bilateral upper extremity supported Standing balance-Leahy Scale: Poor                      Cognition Arousal/Alertness: Awake/alert Behavior During Therapy: WFL for tasks assessed/performed Overall Cognitive Status: Difficult to assess                      Exercises Other Exercises Other Exercises: stand pivot transfer to BSC x 1 w/ RW and mod assist +2; sit to stand x 4 with mod assist +1 w/ assistance given for increased anterior weight shift, ambulation x 6 feet with mod asssit +2 for advancement of walker and tactile cues for appropriate R/L weight shifting to facilitate appropriate step length.  General Comments        Pertinent Vitals/Pain Pain Location: Pt states that her L shoulder hurts, pt did not rate pain on 0-10 scale. Pain Intervention(s): Monitored during session;Limited activity within patient's tolerance    Home Living                      Prior Function            PT Goals  (current goals can now be found in the care plan section)      Frequency  Min 2X/week    PT Plan Current plan remains appropriate    Co-evaluation             End of Session Equipment Utilized During Treatment: Gait belt Activity Tolerance: Patient tolerated treatment well;Patient limited by fatigue Patient left: in chair;with chair alarm set;with call bell/phone within reach     Time: 1440-1509 PT Time Calculation (min) (ACUTE ONLY): 29 min  Charges:                       G CodesGeorgina Peer 01/18/2015, 3:38 PM

## 2015-01-18 NOTE — Progress Notes (Signed)
[facility] Physicians - [place] at [facility]   PATIENT NAME: Joy Clements    MR#:  [id number]  DATE OF BIRTH:  06/28/1926  SUBJECTIVE:  CHIEF COMPLAINT:   Chief Complaint  Patient presents with  . Altered Mental Status   No complaints. Comfortable sitting up in the bed  REVIEW OF SYSTEMS:   Review of Systems  Constitutional: Negative for fever.  Respiratory: Negative for shortness of breath.   Cardiovascular: Negative for chest pain and palpitations.  Gastrointestinal: Negative for nausea, vomiting and abdominal pain.  Genitourinary: Negative for dysuria.   DRUG ALLERGIES:   Allergies  Allergen Reactions  . Ampicillin Other (See Comments)    Unknown reaction not indicated on MAR.    VITALS:  Blood pressure 153/79, pulse 79, temperature 98.9 F (37.2 C), temperature source Oral, resp. rate 20, height  (1.626 m), weight 80.604 kg (177 lb 11.2 oz), SpO2 97 %.  PHYSICAL EXAMINATION:  GENERAL:  81 y.o.-year-old patient sitting up in chair with no acute distress.  LUNGS: Normal breath sounds bilaterally, no wheezing, rales,rhonchi or crepitation. No use of accessory muscles of respiration.  CARDIOVASCULAR: S1, S2 normal. No murmurs, rubs, or gallops.  ABDOMEN: Soft, nontender, nondistended. Bowel sounds present. No organomegaly or mass.  EXTREMITIES: No pedal edema, cyanosis, or clubbing.  NEUROLOGIC: Cranial nerves II through XII are intact. Muscle strength 5/5 in all extremities. Sensation intact.  PSYCHIATRIC: The patient is alert, oriented to person only, pleasant, calm SKIN: Abrasion over right lower extremity, minimal surrounding erythema, no drainage noted  LABORATORY PANEL:   CBC  Recent Labs Lab 01/18/15 0614  WBC 8.9  HGB 10.7*  HCT 31.4*  PLT 138*   ------------------------------------------------------------------------------------------------------------------  Chemistries   Recent Labs Lab 01/16/15 1144   01/18/15 0614  NA 139  < > 136  K 3.9  < > 3.8  CL 103  < > 107  CO2 28  < > 22  GLUCOSE 130*  < > 102*  BUN 27*  < > 15  CREATININE 1.15*  < > 0.80  CALCIUM 9.3  < > 8.6*  AST 115*  --   --   ALT 18  --   --   ALKPHOS 44  --   --   BILITOT 0.9  --   --   < > = values in this interval not displayed. ------------------------------------------------------------------------------------------------------------------  Cardiac Enzymes  Recent Labs Lab 01/16/15 1144  TROPONINI 0.03   ------------------------------------------------------------------------------------------------------------------  RADIOLOGY:  Ct Head Wo Contrast  01/16/2015   CLINICAL DATA:  Larey Seat today. Altered mental status and confusion. History of dementia, Parkinson's disease and stroke.  EXAM: CT HEAD WITHOUT CONTRAST  TECHNIQUE: Contiguous axial images were obtained from the base of the skull through the vertex without intravenous contrast.  COMPARISON:  None.  FINDINGS: Age related cerebral atrophy, ventriculomegaly and periventricular white matter disease. No extra-axial fluid collections are identified. No CT findings for acute hemispheric infarction or intracranial hemorrhage. No mass lesions. The brainstem and cerebellum are normal.  No acute bony findings. No bone lesions. The paranasal sinuses and mastoid air cells are clear. The globes are intact.  IMPRESSION: Age related cerebral atrophy, ventriculomegaly and periventricular white matter disease. No acute intracranial findings or skull fracture.   Electronically Signed   By: Rudie Meyer M.D.   On: 01/16/2015 12:39    EKG:   Orders placed or performed during the hospital encounter of 01/16/15  . EKG 12-Lead  . EKG 12-Lead  ASSESSMENT AND PLAN:   #1 urinary tract infection: Urine cultures pansensitive, continue Bactrim  #2 cellulitis: Erythema seems to be decreasing. Treating with Bactrim with good response.  #3 failure to thrive in adult: Has  history of CVA, Parkinson's and vascular dementia and now has progressive weakness. Physical therapy evaluation is for skilled nursing. Family will come this afternoon to discuss placement with care management.  #3 Parkinson's disease and dementia: Stable at this time no behavioral disturbance. Continue Effexor, Sinemet,  #4 hypertension: Well controlled continue Cozaar, metoprolol,  CODE STATUS: DO NOT RESUSCITATE  TOTAL TIME TAKING CARE OF THIS PATIENT: 30 minutes.  Greater than 50% of time spent in care coordination and counseling. Care plan discussed with social work. Will meet with family this afternoon when her daughter [name] who is the power of attorney is in town. POSSIBLE D/C IN 1-2 DAYS, DEPENDING ON CLINICAL CONDITION.   Elby Showers M.D on 01/18/2015 at 11:33 AM  Between 7am to 6pm - Pager - [phone]  After 6pm go to [email or web address] - password [email or web address] [facility]  [facility] [facility]  Office  [phone]  CC: Primary care physician; No PCP Per Patient

## 2015-01-18 NOTE — Plan of Care (Signed)
Problem: Discharge Progression Outcomes Goal: Other Discharge Outcomes/Goals Outcome: Progressing Patient has no complaints of pain. VSS. IV fluids infusing. Up to chair with PT. Tolerating diet, need assistant with feeding herself. Remains afebrile during shift.

## 2015-01-18 NOTE — Plan of Care (Signed)
Problem: Discharge Progression Outcomes Goal: Other Discharge Outcomes/Goals Outcome: Progressing Plan of care progress to goal: Pt hard of hearing, and has memory loss Pt complains of no pain VSS Diet - soft Q4 neuro checks Pills crushed in applesauce

## 2015-01-18 NOTE — Progress Notes (Addendum)
Clinical Social Worker (CSW) met with patient and her daughter Arbie Cookey at bedside. Per daughter patient can pay privately for SNF. CSW presented bed offers to patient and daughter. Per daughter she is going to tour facilities this evening. CSW made daughter aware that patient will likely be ready for D/C tomorrow. CSW will continue to follow and assist as needed.   Per Seth Bake admissions coordinator at [facility] they can make a bed offer. Patient's daughter Arbie Cookey is aware of above.   Blima Rich, [place] [phone]

## 2015-01-19 MED ORDER — SULFAMETHOXAZOLE-TRIMETHOPRIM 800-160 MG PO TABS: 1.0000 | ORAL_TABLET | Freq: Two times a day (BID) | ORAL | Status: DC

## 2015-01-19 MED ORDER — SULFAMETHOXAZOLE-TRIMETHOPRIM 800-160 MG PO TABS: 1.0000 | ORAL_TABLET | Freq: Two times a day (BID) | ORAL | Status: AC

## 2015-01-19 NOTE — Plan of Care (Signed)
Problem: Discharge Progression Outcomes Goal: Other Discharge Outcomes/Goals Outcome: Progressing Plan of care progress to goal: VSS with exception of elevated temp/tylenol given/resolved No pain reported Pt has memory loss and HOH Soft diet - tolerating meds crushed in applesauce.

## 2015-01-19 NOTE — Clinical Social Work Placement (Signed)
   CLINICAL SOCIAL WORK PLACEMENT  NOTE  Date:  01/19/2015  Patient Details  Name: Joy Clements MRN: [id number] Date of Birth: 20-Aug-1926  Clinical Social Work is seeking post-discharge placement for this patient at the Skilled  Nursing Facility level of care (*CSW will initial, date and re-position this form in  chart as items are completed):  Yes   Patient/family provided with [place] Clinical Social Work Department's list of facilities offering this level of care within the geographic area requested by the patient (or if unable, by the patient's family).  Yes   Patient/family informed of their freedom to choose among providers that offer the needed level of care, that participate in Medicare, Medicaid or managed care program needed by the patient, have an available bed and are willing to accept the patient.  Yes   Patient/family informed of [place]'s ownership interest in [facility] and [facility], as well as of the fact that they are under no obligation to receive care at these facilities.  PASRR submitted to EDS on       PASRR number received on       Existing PASRR number confirmed on 01/19/15     FL2 transmitted to all facilities in geographic area requested by pt/family on 01/19/15     FL2 transmitted to all facilities within larger geographic area on       Patient informed that his/her managed care company has contracts with or will negotiate with certain facilities, including the following:        Yes   Patient/family informed of bed offers received.  Patient chooses bed at  [facility] )     Physician recommends and patient chooses bed at      Patient to be transferred to  [facility] ) on 01/19/15.  Patient to be transferred to facility by  ([facility] )     Patient family notified on 01/19/15 of transfer.  Name of family member notified:   (Patinet's daughter [name] is aware of D/C today. )     PHYSICIAN       Additional Comment:     _______________________________________________ Haig Prophet, LCSW 01/19/2015, 1:40 PM

## 2015-01-19 NOTE — Progress Notes (Signed)
Patient's daughter [name] chose [place]. Patient is medically stable for D/C today. Per Sue Lush admissions coordinator at [facility] patient is going to room 207-A. RN will call report at [phone]. [facility] will transport at 3:30 pm and patient's daughter has agreed to pay the $60 fee to driver today. Clinical Child psychotherapist (CSW) prepared D/C packet and sent D/C Summary to [employer] via carefinder. Patient is paying privately for SNF. RN is aware of above. Please reconsult if future social work needs arise. CSW signing off.   Jetta Lout, LCSWA [phone]

## 2015-01-19 NOTE — Discharge Summary (Signed)
[facility] [place] NAME: Joy Clements    MR#:  [id number]  DATE OF BIRTH:  15-Sep-1926  DATE OF ADMISSION:  01/16/2015 ADMITTING PHYSICIAN: Idelle Crouch, MD  DATE OF DISCHARGE: 01/19/2015  PRIMARY CARE PHYSICIAN: No PCP Per Patient    ADMISSION DIAGNOSIS:  UTI (lower urinary tract infection) [N39.0] Renal insufficiency [N28.9] Cellulitis of right lower extremity [L03.115] Fall, initial encounter [W19.XXXA] Abrasion of lower extremity, right, initial encounter [S80.811A]  DISCHARGE DIAGNOSIS:  Active Problems:   Failure to thrive in adult   Acute UTI   Cellulitis   Adult failure to thrive   SECONDARY DIAGNOSIS:   Past Medical History  Diagnosis Date  . Stroke   . Parkinson's disease   . Dementia     HOSPITAL COURSE:    #1 urinary tract infection: Urine cultures pansensitive Escherichia coli. Continue Bactrim to complete a seven-day course.  #2 cellulitis: Right lower leg abrasion near the knee. Currently dressed. Erythema seems to be decreasing. Treating with Bactrim.   #3 failure to thrive in adult: Has history of CVA, Parkinson's and vascular dementia and now has progressive weakness. Physical therapy evaluation is for skilled nursing. Social work has met with the family and obtained nursing home placement.  #3 Parkinson's disease and dementia: Stable at this time no behavioral disturbance. Continue Effexor, Sinemet,  #4 hypertension: Well controlled continue Cozaar, metoprolol.  CODE STATUS: DO NOT RESUSCITATE  DISCHARGE CONDITIONS:   Stable   CONSULTS OBTAINED:    none  DRUG ALLERGIES:   Allergies  Allergen Reactions  . Ampicillin Other (See Comments)    Unknown reaction not indicated on MAR.    DISCHARGE MEDICATIONS:   Current Discharge Medication List    START taking these medications   Details  sulfamethoxazole-trimethoprim (BACTRIM DS,SEPTRA DS) 800-160  MG per tablet Take 1 tablet by mouth every 12 (twelve) hours. Qty: 8 tablet, Refills: 0      CONTINUE these medications which have NOT CHANGED   Details  acetaminophen (TYLENOL) 325 MG tablet Take 650 mg by mouth 2 (two) times daily.    aspirin 81 MG chewable tablet Chew 81 mg by mouth daily.    carbidopa-levodopa (SINEMET IR) 25-250 MG per tablet Take 1 tablet by mouth 2 (two) times daily.    losartan (COZAAR) 50 MG tablet Take 50 mg by mouth daily.    magnesium hydroxide (MILK OF MAGNESIA) 400 MG/5ML suspension Take 30 mLs by mouth daily as needed for mild constipation or moderate constipation.    metoprolol succinate (TOPROL-XL) 50 MG 24 hr tablet Take 50 mg by mouth daily. Take with or immediately following a meal.    nystatin (MYCOSTATIN/NYSTOP) 100000 UNIT/GM POWD Apply 1 g topically 2 (two) times daily. Apply to breast folds until healed.    omeprazole (PRILOSEC) 20 MG capsule Take 20 mg by mouth daily before breakfast. Give 1 hour before breakfast.    predniSONE (DELTASONE) 5 MG tablet Take 5 mg by mouth daily.    primidone (MYSOLINE) 250 MG tablet Take 250 mg by mouth at bedtime.    senna (SENOKOT) 8.6 MG TABS tablet Take 2 tablets by mouth daily as needed for mild constipation or moderate constipation. Hold 2 days if diarrhea.    venlafaxine (EFFEXOR) 37.5 MG tablet Take 37.5 mg by mouth daily.         DISCHARGE INSTRUCTIONS:    DIET:  Cardiac diet  DISCHARGE CONDITION:  Stable  ACTIVITY:  Activity as tolerated  OXYGEN:  Home Oxygen: No.   Oxygen Delivery: room air  DISCHARGE LOCATION:  nursing home   If you experience worsening of your admission symptoms, develop shortness of breath, life threatening emergency, suicidal or homicidal thoughts you must seek medical attention immediately by calling 911 or calling your MD immediately  if symptoms less severe.  You Must read complete instructions/literature along with all the possible adverse  reactions/side effects for all the Medicines you take and that have been prescribed to you. Take any new Medicines after you have completely understood and accpet all the possible adverse reactions/side effects.   Please note  You were cared for by a hospitalist during your hospital stay. If you have any questions about your discharge medications or the care you received while you were in the hospital after you are discharged, you can call the unit and asked to speak with the hospitalist on call if the hospitalist that took care of you is not available. Once you are discharged, your primary care physician will handle any further medical issues. Please note that NO REFILLS for any discharge medications will be authorized once you are discharged, as it is imperative that you return to your primary care physician (or establish a relationship with a primary care physician if you do not have one) for your aftercare needs so that they can reassess your need for medications and monitor your lab values.    Today   CHIEF COMPLAINT:   Chief Complaint  Patient presents with  . Altered Mental Status    HISTORY OF PRESENT ILLNESS:  Joy Clements is a 81 y.o. female has a past medical history significant for CVA, Parkinson's and vascular dementia now with progressive weakness and inability to ambulate. Falling often. Brought to ER where she was found to have a RLE abrasion with localized cellulitis and UTI. Unable to stand or ambulate in ER. Cannot be cared for at her ALF. She is now admitted for further evaluation.  VITAL SIGNS:  Blood pressure 150/88, pulse 73, temperature 97.9 F (36.6 C), temperature source Oral, resp. rate 18, height 5' 4" (1.626 m), weight 78.971 kg (174 lb 1.6 oz), SpO2 98 %.  I/O:   Intake/Output Summary (Last 24 hours) at 01/19/15 1104 Last data filed at 01/18/15 2050  Gross per 24 hour  Intake   2577 ml  Output      0 ml  Net   2577 ml    PHYSICAL EXAMINATION:   GENERAL: 81 y.o.-year-old patient, awakes from a deep sleep, denies any distress  LUNGS: Normal breath sounds bilaterally, no wheezing, rales,rhonchi or crepitation. No use of accessory muscles of respiration.  CARDIOVASCULAR: S1, S2 normal. No murmurs, rubs, or gallops.  ABDOMEN: Soft, nontender, nondistended. Bowel sounds present. No organomegaly or mass.  EXTREMITIES: No pedal edema, cyanosis, or clubbing.  NEUROLOGIC: Cranial nerves II through XII are grossly intact. Muscle strength 5/5 in all extremities.  PSYCHIATRIC: The patient is alert, oriented to person only, pleasant, calm SKIN: Abrasion over right lower extremity near the knee, minimal surrounding erythema, no drainage noted   DATA REVIEW:   CBC  Recent Labs Lab 01/18/15 0614  WBC 8.9  HGB 10.7*  HCT 31.4*  PLT 138*    Chemistries   Recent Labs Lab 01/16/15 1144  01/18/15 0614  NA 139  < > 136  K 3.9  < > 3.8  CL 103  < > 107  CO2 28  < > 22  GLUCOSE  130*  < > 102*  BUN 27*  < > 15  CREATININE 1.15*  < > 0.80  CALCIUM 9.3  < > 8.6*  AST 115*  --   --   ALT 18  --   --   ALKPHOS 44  --   --   BILITOT 0.9  --   --   < > = values in this interval not displayed.  Cardiac Enzymes  Recent Labs Lab 01/16/15 1144  TROPONINI 0.03    Microbiology Results  Results for orders placed or performed during the hospital encounter of 01/16/15  Urine culture     Status: None   Collection Time: 01/16/15 11:44 AM  Result Value Ref Range Status   Specimen Description URINE, CATHETERIZED  Final   Special Requests NONE  Final   Culture >=100,000 COLONIES/mL ESCHERICHIA COLI  Final   Report Status 01/18/2015 FINAL  Final   Organism ID, Bacteria ESCHERICHIA COLI  Final      Susceptibility   Escherichia coli - MIC*    AMPICILLIN 4 SENSITIVE Sensitive     CEFTAZIDIME <=1 SENSITIVE Sensitive     CEFAZOLIN <=4 SENSITIVE Sensitive     CEFTRIAXONE <=1 SENSITIVE Sensitive     CIPROFLOXACIN <=0.25 SENSITIVE  Sensitive     GENTAMICIN <=1 SENSITIVE Sensitive     IMIPENEM <=0.25 SENSITIVE Sensitive     TRIMETH/SULFA <=20 SENSITIVE Sensitive     NITROFURANTOIN Value in next row Sensitive      SENSITIVE<=16    PIP/TAZO Value in next row Sensitive      SENSITIVE<=4    * >=100,000 COLONIES/mL ESCHERICHIA COLI    RADIOLOGY:  No results found.  EKG:   Orders placed or performed during the hospital encounter of 01/16/15  . EKG 12-Lead  . EKG 12-Lead      Management plans discussed with the patient, family and they are in agreement.  CODE STATUS:     Code Status Orders        Start     Ordered   01/16/15 1504  Do not attempt resuscitation (DNR)   Continuous    Question Answer Comment  In the event of cardiac or respiratory ARREST Do not call a "code blue"   In the event of cardiac or respiratory ARREST Do not perform Intubation, CPR, defibrillation or ACLS   In the event of cardiac or respiratory ARREST Use medication by any route, position, wound care, and other measures to relive pain and suffering. May use oxygen, suction and manual treatment of airway obstruction as needed for comfort.      01/16/15 1503    Advance Directive Documentation        Most Recent Value   Type of Advance Directive  Out of facility DNR (pink MOST or yellow form)   Pre-existing out of facility DNR order (yellow form or pink MOST form)  Yellow form placed in chart (order not valid for inpatient use)   "MOST" Form in Place?        TOTAL TIME TAKING CARE OF THIS PATIENT: 35 minutes.  Greater than 50% of time spent in care coordination and counseling.  Myrtis Ser M.D on 01/19/2015 at 11:04 AM  Between 7am to 6pm - Pager - [phone]  After 6pm go to [email or web address] - password [email or web address] [facility]  [facility]  Office  [phone]  CC: Primary care physician; No PCP Per Patient

## 2015-01-19 NOTE — Progress Notes (Signed)
MD making rounds. Discharge orders received. Social Worker facilitating discharge to [facility]. IV removed. VSS. No s/s of distress noted. Denies pain. Denies needs. Report called to Dewayne Hatch, Charity fundraiser, BSN at [facility]. No unanswered questions. Awaiting [facility] for transport to [facility]. Fredric Mare, Visual merchandiser (CSW) prepared Discharge packet for [facility]. Discharge packet handed to transporter for [facility].

## 2015-01-20 DIAGNOSIS — F39 Unspecified mood [affective] disorder: Secondary | ICD-10-CM

## 2015-01-20 DIAGNOSIS — I10 Essential (primary) hypertension: Secondary | ICD-10-CM | POA: Diagnosis not present

## 2015-01-20 DIAGNOSIS — G20 Parkinson's disease: Secondary | ICD-10-CM | POA: Diagnosis not present

## 2015-01-20 DIAGNOSIS — F0150 Vascular dementia without behavioral disturbance: Secondary | ICD-10-CM

## 2015-01-20 DIAGNOSIS — K219 Gastro-esophageal reflux disease without esophagitis: Secondary | ICD-10-CM | POA: Diagnosis not present

## 2015-01-20 DIAGNOSIS — M159 Polyosteoarthritis, unspecified: Secondary | ICD-10-CM | POA: Diagnosis not present

## 2015-01-21 DIAGNOSIS — T887XXA Unspecified adverse effect of drug or medicament, initial encounter: Secondary | ICD-10-CM | POA: Diagnosis not present

## 2015-02-08 DIAGNOSIS — I10 Essential (primary) hypertension: Secondary | ICD-10-CM | POA: Diagnosis not present

## 2015-02-08 DIAGNOSIS — F0150 Vascular dementia without behavioral disturbance: Secondary | ICD-10-CM | POA: Diagnosis not present

## 2015-02-08 DIAGNOSIS — G20 Parkinson's disease: Secondary | ICD-10-CM | POA: Diagnosis not present

## 2015-02-08 DIAGNOSIS — F411 Generalized anxiety disorder: Secondary | ICD-10-CM

## 2015-02-08 DIAGNOSIS — K219 Gastro-esophageal reflux disease without esophagitis: Secondary | ICD-10-CM | POA: Diagnosis not present

## 2015-03-03 DIAGNOSIS — I69398 Other sequelae of cerebral infarction: Secondary | ICD-10-CM | POA: Diagnosis not present

## 2015-03-03 DIAGNOSIS — F411 Generalized anxiety disorder: Secondary | ICD-10-CM

## 2015-03-03 DIAGNOSIS — I10 Essential (primary) hypertension: Secondary | ICD-10-CM | POA: Diagnosis not present

## 2015-03-03 DIAGNOSIS — F0150 Vascular dementia without behavioral disturbance: Secondary | ICD-10-CM

## 2015-03-03 DIAGNOSIS — G20 Parkinson's disease: Secondary | ICD-10-CM | POA: Diagnosis not present

## 2015-03-03 DIAGNOSIS — K219 Gastro-esophageal reflux disease without esophagitis: Secondary | ICD-10-CM | POA: Diagnosis not present

## 2015-04-15 DIAGNOSIS — G20 Parkinson's disease: Secondary | ICD-10-CM | POA: Diagnosis not present

## 2015-04-15 DIAGNOSIS — K219 Gastro-esophageal reflux disease without esophagitis: Secondary | ICD-10-CM | POA: Diagnosis not present

## 2015-04-15 DIAGNOSIS — F0150 Vascular dementia without behavioral disturbance: Secondary | ICD-10-CM

## 2015-04-15 DIAGNOSIS — F419 Anxiety disorder, unspecified: Secondary | ICD-10-CM

## 2015-04-15 DIAGNOSIS — I10 Essential (primary) hypertension: Secondary | ICD-10-CM | POA: Diagnosis not present

## 2015-04-15 DIAGNOSIS — I69398 Other sequelae of cerebral infarction: Secondary | ICD-10-CM

## 2015-06-14 DIAGNOSIS — K219 Gastro-esophageal reflux disease without esophagitis: Secondary | ICD-10-CM

## 2015-06-14 DIAGNOSIS — G20 Parkinson's disease: Secondary | ICD-10-CM

## 2015-06-14 DIAGNOSIS — I10 Essential (primary) hypertension: Secondary | ICD-10-CM

## 2015-06-14 DIAGNOSIS — F39 Unspecified mood [affective] disorder: Secondary | ICD-10-CM

## 2015-06-14 DIAGNOSIS — F0150 Vascular dementia without behavioral disturbance: Secondary | ICD-10-CM

## 2015-06-30 DIAGNOSIS — F419 Anxiety disorder, unspecified: Secondary | ICD-10-CM | POA: Diagnosis not present

## 2015-10-27 DIAGNOSIS — I69398 Other sequelae of cerebral infarction: Secondary | ICD-10-CM | POA: Diagnosis not present

## 2015-10-27 DIAGNOSIS — G20 Parkinson's disease: Secondary | ICD-10-CM

## 2015-10-27 DIAGNOSIS — F39 Unspecified mood [affective] disorder: Secondary | ICD-10-CM

## 2015-10-27 DIAGNOSIS — I10 Essential (primary) hypertension: Secondary | ICD-10-CM

## 2015-10-27 DIAGNOSIS — K219 Gastro-esophageal reflux disease without esophagitis: Secondary | ICD-10-CM

## 2015-10-27 DIAGNOSIS — F0151 Vascular dementia with behavioral disturbance: Secondary | ICD-10-CM

## 2015-11-15 ENCOUNTER — Telehealth: Payer: Self-pay

## 2015-11-15 NOTE — Telephone Encounter (Signed)
Pt 's daughter called about coverage review for alprazolam; advised to contact [facility]. Okey RegalCarol voiced understanding.

## 2015-12-14 DIAGNOSIS — I10 Essential (primary) hypertension: Secondary | ICD-10-CM | POA: Diagnosis not present

## 2015-12-14 DIAGNOSIS — K219 Gastro-esophageal reflux disease without esophagitis: Secondary | ICD-10-CM | POA: Diagnosis not present

## 2015-12-14 DIAGNOSIS — I69398 Other sequelae of cerebral infarction: Secondary | ICD-10-CM | POA: Diagnosis not present

## 2015-12-14 DIAGNOSIS — G20 Parkinson's disease: Secondary | ICD-10-CM | POA: Diagnosis not present

## 2015-12-14 DIAGNOSIS — F0150 Vascular dementia without behavioral disturbance: Secondary | ICD-10-CM

## 2016-01-06 DIAGNOSIS — M25562 Pain in left knee: Secondary | ICD-10-CM | POA: Diagnosis not present

## 2016-02-24 DIAGNOSIS — I10 Essential (primary) hypertension: Secondary | ICD-10-CM | POA: Diagnosis not present

## 2016-02-24 DIAGNOSIS — F22 Delusional disorders: Secondary | ICD-10-CM

## 2016-02-24 DIAGNOSIS — K219 Gastro-esophageal reflux disease without esophagitis: Secondary | ICD-10-CM

## 2016-02-24 DIAGNOSIS — G20 Parkinson's disease: Secondary | ICD-10-CM

## 2016-02-24 DIAGNOSIS — I69398 Other sequelae of cerebral infarction: Secondary | ICD-10-CM

## 2016-04-13 DIAGNOSIS — K089 Disorder of teeth and supporting structures, unspecified: Secondary | ICD-10-CM | POA: Diagnosis not present

## 2016-04-26 DIAGNOSIS — I10 Essential (primary) hypertension: Secondary | ICD-10-CM

## 2016-04-26 DIAGNOSIS — I69398 Other sequelae of cerebral infarction: Secondary | ICD-10-CM

## 2016-04-26 DIAGNOSIS — F0150 Vascular dementia without behavioral disturbance: Secondary | ICD-10-CM

## 2016-04-26 DIAGNOSIS — G20 Parkinson's disease: Secondary | ICD-10-CM | POA: Diagnosis not present

## 2016-04-26 DIAGNOSIS — K219 Gastro-esophageal reflux disease without esophagitis: Secondary | ICD-10-CM

## 2016-04-26 DIAGNOSIS — F39 Unspecified mood [affective] disorder: Secondary | ICD-10-CM

## 2016-06-20 DIAGNOSIS — K219 Gastro-esophageal reflux disease without esophagitis: Secondary | ICD-10-CM | POA: Diagnosis not present

## 2016-06-20 DIAGNOSIS — I10 Essential (primary) hypertension: Secondary | ICD-10-CM

## 2016-06-20 DIAGNOSIS — I69398 Other sequelae of cerebral infarction: Secondary | ICD-10-CM | POA: Diagnosis not present

## 2016-06-20 DIAGNOSIS — G3183 Dementia with Lewy bodies: Secondary | ICD-10-CM

## 2016-06-20 DIAGNOSIS — F323 Major depressive disorder, single episode, severe with psychotic features: Secondary | ICD-10-CM

## 2016-06-20 DIAGNOSIS — F0150 Vascular dementia without behavioral disturbance: Secondary | ICD-10-CM

## 2016-06-30 IMAGING — CT CT HEAD W/O CM
1 series · 16 of 30 positions shown, 20 images · non-contrast
Comparison: None.

CLINICAL DATA: Fell today. Altered mental status and confusion.
History of dementia, Parkinson's disease and stroke.

EXAM:
CT HEAD WITHOUT CONTRAST
TECHNIQUE: Contiguous axial images were obtained from the base of the skull
through the vertex without intravenous contrast.

[Series 2: head wo · axial · 0.42mm/px · z∈[+423,+549]mm · 16 of 32 slices shown, 20 images]
[im 2/32  brain]
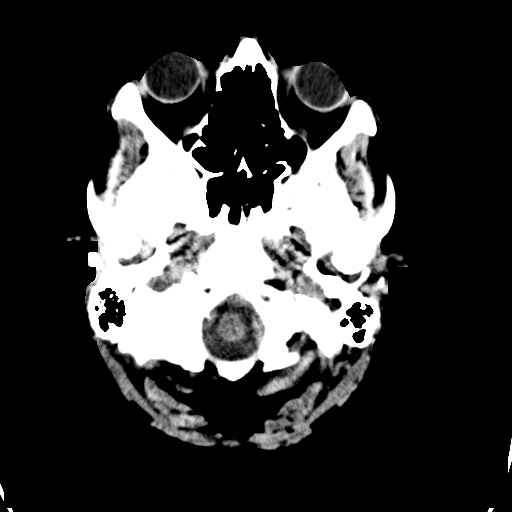
[im 2/32  bone]
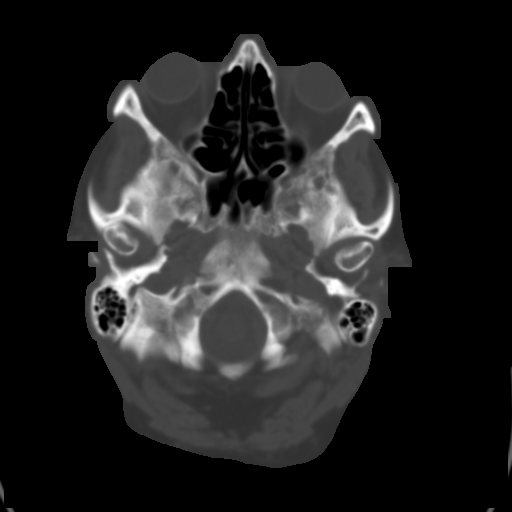
[im 4/32  brain]
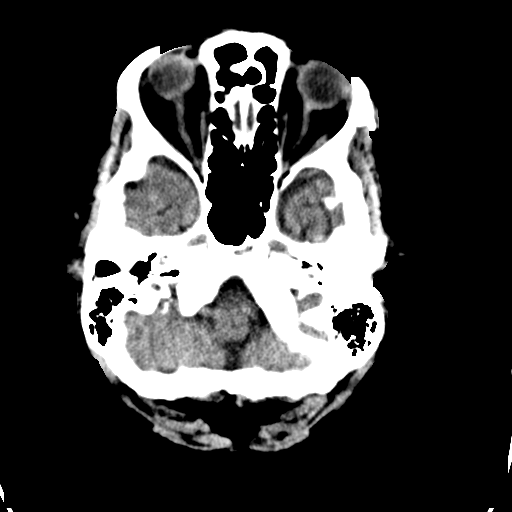
[im 6/32  brain]
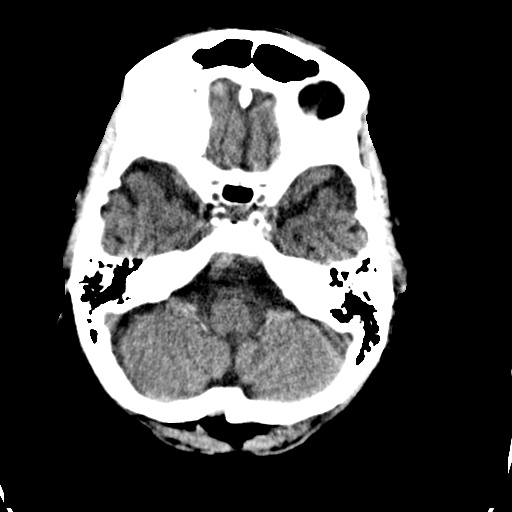
[im 8/32  brain]
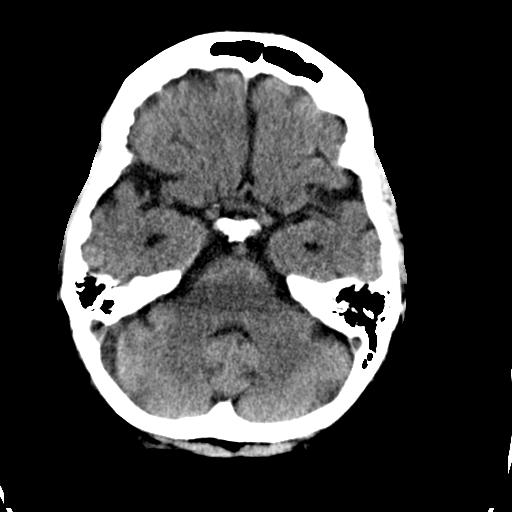
[im 9/32  brain]
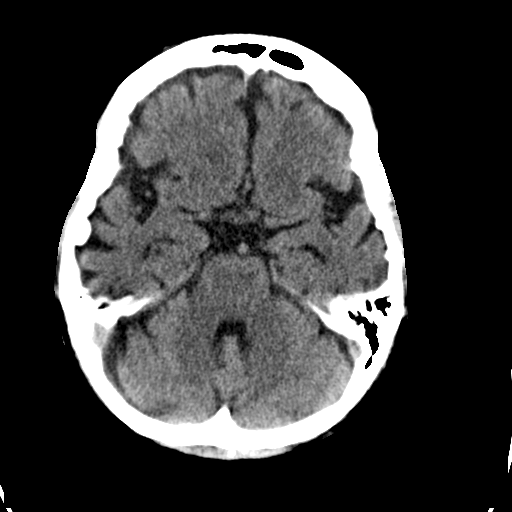
[im 9/32  bone]
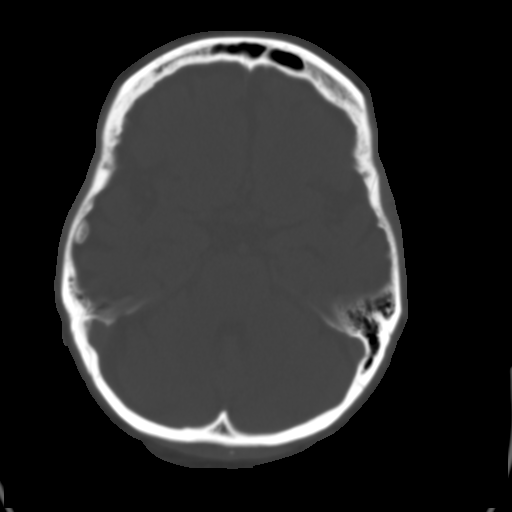
[im 11/32  brain]
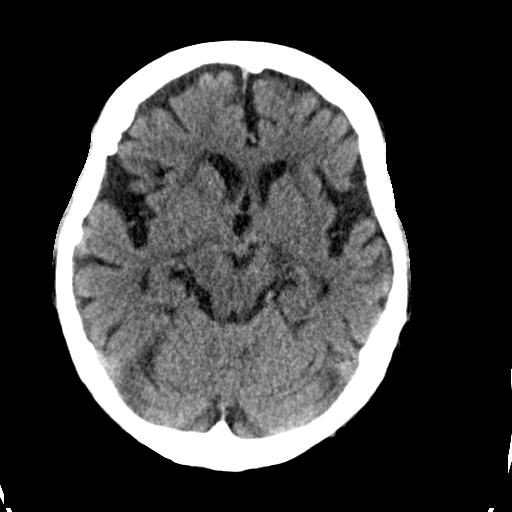
[im 13/32  brain]
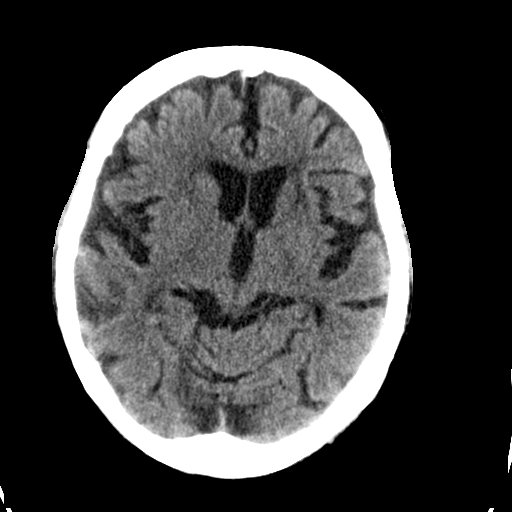
[im 15/32  brain]
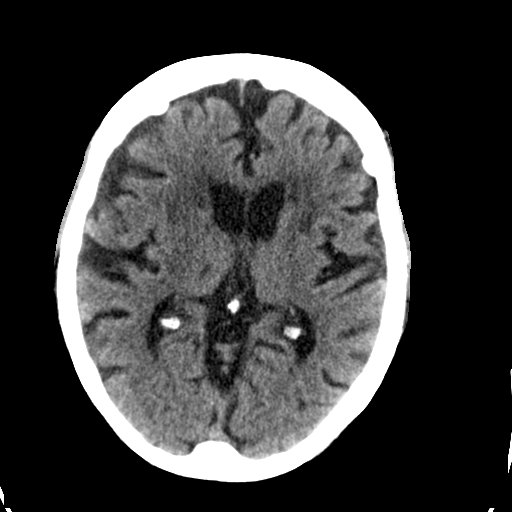
[im 17/32  brain]
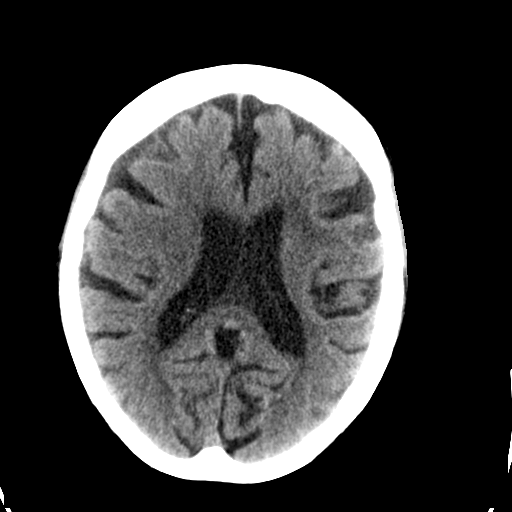
[im 17/32  bone]
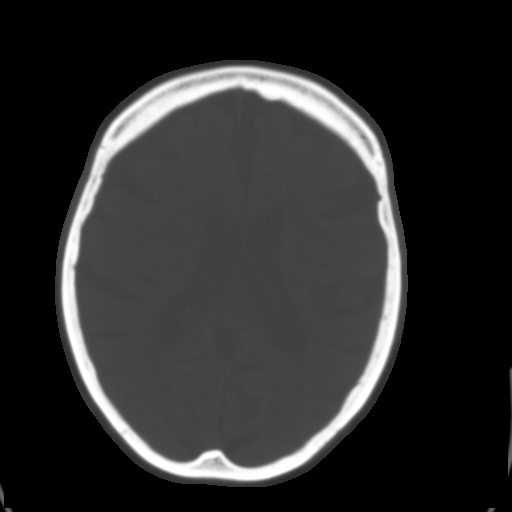
[im 19/32  brain]
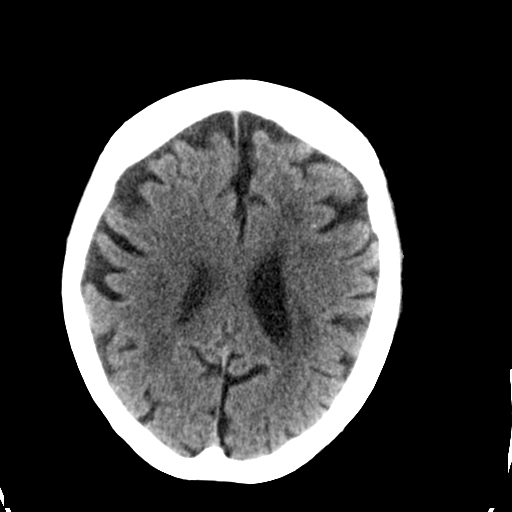
[im 21/32  brain]
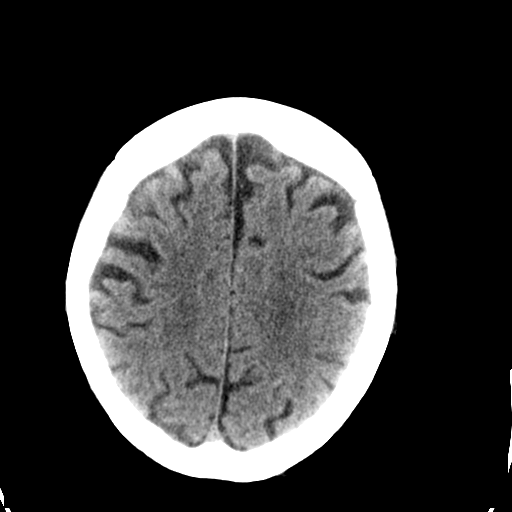
[im 23/32  brain]
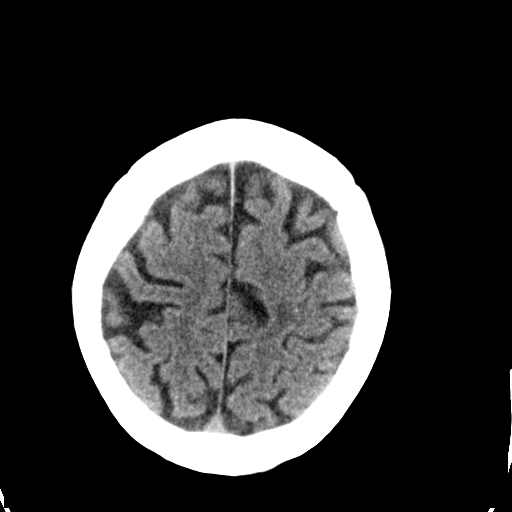
[im 24/32  brain]
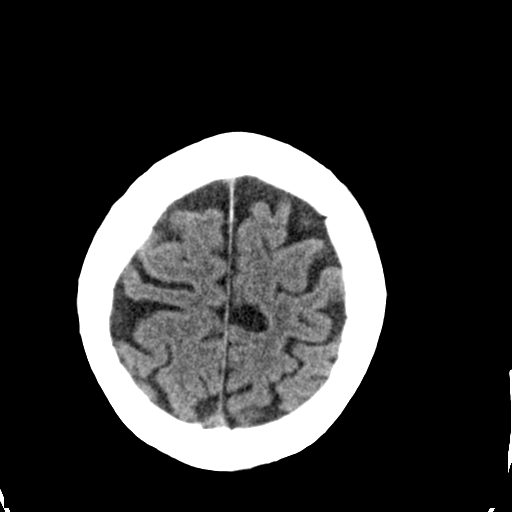
[im 24/32  bone]
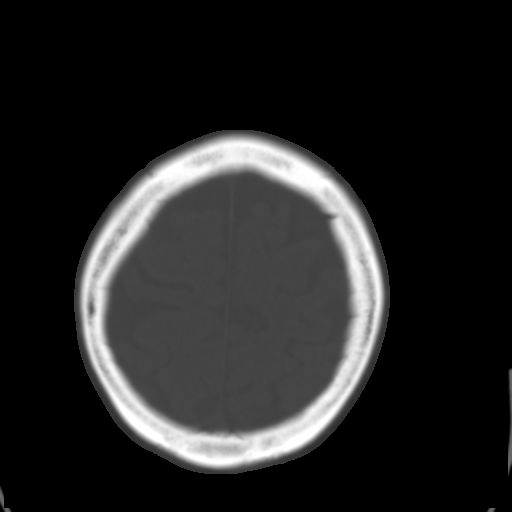
[im 26/32  brain]
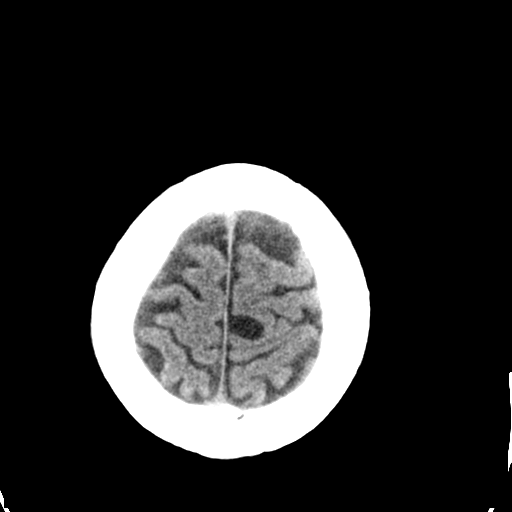
[im 28/32  brain]
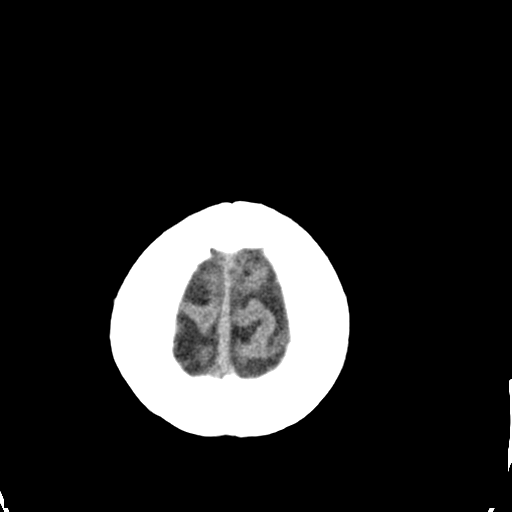
[im 30/32  brain]
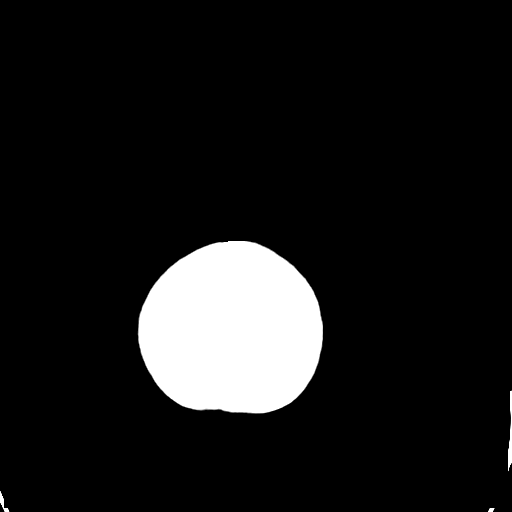

[16 of 30 positions shown; findings below may reference images not displayed]

FINDINGS: Age related cerebral atrophy, ventriculomegaly and periventricular
white matter disease. No extra-axial fluid collections are
identified. No CT findings for acute hemispheric infarction or
intracranial hemorrhage. No mass lesions. The brainstem and
cerebellum are normal.

No acute bony findings. No bone lesions. The paranasal sinuses and
mastoid air cells are clear. The globes are intact.
IMPRESSION: Age related cerebral atrophy, ventriculomegaly and periventricular
white matter disease. No acute intracranial findings or skull
fracture.

## 2016-08-21 DIAGNOSIS — I10 Essential (primary) hypertension: Secondary | ICD-10-CM

## 2016-08-21 DIAGNOSIS — F0150 Vascular dementia without behavioral disturbance: Secondary | ICD-10-CM | POA: Diagnosis not present

## 2016-08-21 DIAGNOSIS — G20 Parkinson's disease: Secondary | ICD-10-CM | POA: Diagnosis not present

## 2016-08-21 DIAGNOSIS — F39 Unspecified mood [affective] disorder: Secondary | ICD-10-CM

## 2016-08-21 DIAGNOSIS — K219 Gastro-esophageal reflux disease without esophagitis: Secondary | ICD-10-CM

## 2016-10-19 DIAGNOSIS — I69398 Other sequelae of cerebral infarction: Secondary | ICD-10-CM

## 2016-10-19 DIAGNOSIS — I10 Essential (primary) hypertension: Secondary | ICD-10-CM

## 2016-10-19 DIAGNOSIS — G20 Parkinson's disease: Secondary | ICD-10-CM

## 2016-10-19 DIAGNOSIS — K219 Gastro-esophageal reflux disease without esophagitis: Secondary | ICD-10-CM

## 2016-10-19 DIAGNOSIS — F0150 Vascular dementia without behavioral disturbance: Secondary | ICD-10-CM | POA: Diagnosis not present

## 2016-10-19 DIAGNOSIS — F39 Unspecified mood [affective] disorder: Secondary | ICD-10-CM | POA: Diagnosis not present

## 2016-11-28 DEATH — deceased
